# Patient Record
Sex: Female | Born: 2000 | Hispanic: Yes | Marital: Single | Smoking: Never smoker
Health system: Southern US, Community
[De-identification: ages and names within clinical notes are randomized; demographics above are authoritative.]

---
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
## 2020-08-23 ENCOUNTER — Other Ambulatory Visit: Payer: Self-pay

## 2020-08-23 ENCOUNTER — Emergency Department
Admission: EM | Admit: 2020-08-23 | Discharge: 2020-08-23 | Disposition: A | Discharge status: Home / Self Care | Payer: Medicaid Other | Attending: Emergency Medicine | Admitting: Emergency Medicine

## 2020-08-23 DIAGNOSIS — R109 Unspecified abdominal pain: Secondary | ICD-10-CM | POA: Insufficient documentation

## 2020-08-23 DIAGNOSIS — R110 Nausea: Secondary | ICD-10-CM | POA: Diagnosis not present

## 2020-08-23 DIAGNOSIS — Z5321 Procedure and treatment not carried out due to patient leaving prior to being seen by health care provider: Secondary | ICD-10-CM | POA: Diagnosis not present

## 2020-08-23 LAB — COMPREHENSIVE METABOLIC PANEL
ALT: 36 U/L (ref 0–44)
AST: 28 U/L (ref 15–41)
Albumin: 4.4 g/dL (ref 3.5–5.0)
Alkaline Phosphatase: 68 U/L (ref 38–126)
Anion gap: 10 (ref 5–15)
BUN: 9 mg/dL (ref 6–20)
CO2: 21 mmol/L — ABNORMAL LOW (ref 22–32)
Calcium: 9.1 mg/dL (ref 8.9–10.3)
Chloride: 108 mmol/L (ref 98–111)
Creatinine, Ser: 0.59 mg/dL (ref 0.44–1.00)
GFR, Estimated: >60 mL/min (ref >60)
Glucose, Bld: 102 mg/dL — ABNORMAL HIGH (ref 70–99)
Potassium: 4.1 mmol/L (ref 3.5–5.1)
Sodium: 139 mmol/L (ref 135–145)
Total Bilirubin: 0.6 mg/dL (ref 0.3–1.2)
Total Protein: 7.4 g/dL (ref 6.5–8.1)

## 2020-08-23 LAB — URINALYSIS, COMPLETE (UACMP) WITH MICROSCOPIC
Bacteria, UA: NONE SEEN
Bilirubin Urine: NEGATIVE
Glucose, UA: NEGATIVE mg/dL
Ketones, ur: NEGATIVE mg/dL
Leukocytes,Ua: NEGATIVE
Nitrite: NEGATIVE
Protein, ur: NEGATIVE mg/dL
Specific Gravity, Urine: 1.013 (ref 1.005–1.030)
pH: 6 (ref 5.0–8.0)

## 2020-08-23 LAB — POC URINE PREG, ED: Preg Test, Ur: NEGATIVE

## 2020-08-23 LAB — CBC
HCT: 39.3 % (ref 36.0–46.0)
Hemoglobin: 12.8 g/dL (ref 12.0–15.0)
MCH: 28.3 pg (ref 26.0–34.0)
MCHC: 32.6 g/dL (ref 30.0–36.0)
MCV: 86.9 fL (ref 80.0–100.0)
Platelets: 319 10*3/uL (ref 150–400)
RBC: 4.52 MIL/uL (ref 3.87–5.11)
RDW: 13.4 % (ref 11.5–15.5)
WBC: 9.3 10*3/uL (ref 4.0–10.5)
nRBC: 0 % (ref 0.0–0.2)

## 2020-08-23 LAB — LIPASE, BLOOD: Lipase: 30 U/L (ref 11–51)

## 2020-08-23 NOTE — ED Triage Notes (Signed)
Pt comes via pOV from home with c/o left sided pain that started this am. Pt states some nausea.

## 2021-07-25 ENCOUNTER — Other Ambulatory Visit: Payer: Self-pay | Admitting: Family Medicine

## 2021-07-25 ENCOUNTER — Ambulatory Visit
Admission: RE | Admit: 2021-07-25 | Discharge: 2021-07-25 | Disposition: A | Discharge status: Home / Self Care | Payer: Medicaid Other | Source: Ambulatory Visit | Attending: Family Medicine | Admitting: Family Medicine

## 2021-07-25 ENCOUNTER — Other Ambulatory Visit: Payer: Self-pay

## 2021-07-25 DIAGNOSIS — N939 Abnormal uterine and vaginal bleeding, unspecified: Secondary | ICD-10-CM | POA: Insufficient documentation

## 2022-05-08 ENCOUNTER — Emergency Department
Admission: EM | Admit: 2022-05-08 | Discharge: 2022-05-08 | Disposition: A | Discharge status: Home / Self Care | Payer: Medicaid Other | Attending: Emergency Medicine | Admitting: Emergency Medicine

## 2022-05-08 ENCOUNTER — Emergency Department: Payer: Medicaid Other

## 2022-05-08 ENCOUNTER — Other Ambulatory Visit: Payer: Self-pay

## 2022-05-08 ENCOUNTER — Encounter: Payer: Self-pay | Admitting: Emergency Medicine

## 2022-05-08 DIAGNOSIS — Y92410 Unspecified street and highway as the place of occurrence of the external cause: Secondary | ICD-10-CM | POA: Diagnosis not present

## 2022-05-08 DIAGNOSIS — M545 Low back pain, unspecified: Secondary | ICD-10-CM

## 2022-05-08 DIAGNOSIS — M25572 Pain in left ankle and joints of left foot: Secondary | ICD-10-CM | POA: Diagnosis not present

## 2022-05-08 MED ORDER — LIDOCAINE 5 % EX PTCH
1.0000 | MEDICATED_PATCH | CUTANEOUS | Status: DC
  Administered 2022-05-08: 1 via TRANSDERMAL
  Filled 2022-05-08: qty 1

## 2022-05-08 MED ORDER — ACETAMINOPHEN 325 MG PO TABS
650.0000 mg | ORAL_TABLET | Freq: Once | ORAL | Status: AC
  Administered 2022-05-08: 650 mg via ORAL
  Filled 2022-05-08: qty 2

## 2022-05-08 MED ORDER — IBUPROFEN 600 MG PO TABS
600.0000 mg | ORAL_TABLET | Freq: Once | ORAL | Status: AC
  Administered 2022-05-08: 600 mg via ORAL
  Filled 2022-05-08: qty 1

## 2022-05-08 MED ORDER — LIDOCAINE 5 % EX PTCH: 1.0000 | MEDICATED_PATCH | Freq: Two times a day (BID) | CUTANEOUS | 0 refills | Status: AC
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
## 2022-05-08 NOTE — Discharge Instructions (Signed)
Your x-rays were normal.  You may continue to take Tylenol/ibuprofen per package instructions to help with your pain.  You may also use the Lidoderm patches as prescribed.  Please return for any new, worsening, or change in symptoms or other concerns.  It was a pleasure caring for you today.
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
## 2022-06-26 ENCOUNTER — Emergency Department: Payer: Medicaid Other

## 2022-06-26 ENCOUNTER — Other Ambulatory Visit: Payer: Self-pay

## 2022-06-26 ENCOUNTER — Emergency Department
Admission: EM | Admit: 2022-06-26 | Discharge: 2022-06-26 | Discharge status: Against Medical Advice | Payer: Medicaid Other | Attending: Emergency Medicine | Admitting: Emergency Medicine

## 2022-06-26 DIAGNOSIS — R1030 Lower abdominal pain, unspecified: Secondary | ICD-10-CM | POA: Diagnosis not present

## 2022-06-26 DIAGNOSIS — R112 Nausea with vomiting, unspecified: Secondary | ICD-10-CM | POA: Insufficient documentation

## 2022-06-26 DIAGNOSIS — Z5329 Procedure and treatment not carried out because of patient's decision for other reasons: Secondary | ICD-10-CM | POA: Diagnosis not present

## 2022-06-26 DIAGNOSIS — M549 Dorsalgia, unspecified: Secondary | ICD-10-CM | POA: Diagnosis not present

## 2022-06-26 LAB — CBC
HCT: 41.4 % (ref 36.0–46.0)
Hemoglobin: 13.3 g/dL (ref 12.0–15.0)
MCH: 28.5 pg (ref 26.0–34.0)
MCHC: 32.1 g/dL (ref 30.0–36.0)
MCV: 88.7 fL (ref 80.0–100.0)
Platelets: 329 10*3/uL (ref 150–400)
RBC: 4.67 MIL/uL (ref 3.87–5.11)
RDW: 13.2 % (ref 11.5–15.5)
WBC: 8.2 10*3/uL (ref 4.0–10.5)
nRBC: 0 % (ref 0.0–0.2)

## 2022-06-26 LAB — URINALYSIS, ROUTINE W REFLEX MICROSCOPIC
Bilirubin Urine: NEGATIVE
Glucose, UA: NEGATIVE mg/dL
Ketones, ur: NEGATIVE mg/dL
Leukocytes,Ua: NEGATIVE
Nitrite: NEGATIVE
Protein, ur: NEGATIVE mg/dL
Specific Gravity, Urine: 1.012 (ref 1.005–1.030)
pH: 8 (ref 5.0–8.0)

## 2022-06-26 LAB — COMPREHENSIVE METABOLIC PANEL
ALT: 22 U/L (ref 0–44)
AST: 23 U/L (ref 15–41)
Albumin: 4.4 g/dL (ref 3.5–5.0)
Alkaline Phosphatase: 71 U/L (ref 38–126)
Anion gap: 6 (ref 5–15)
BUN: 10 mg/dL (ref 6–20)
CO2: 25 mmol/L (ref 22–32)
Calcium: 9.1 mg/dL (ref 8.9–10.3)
Chloride: 108 mmol/L (ref 98–111)
Creatinine, Ser: 0.73 mg/dL (ref 0.44–1.00)
GFR, Estimated: >60 mL/min (ref >60)
Glucose, Bld: 102 mg/dL — ABNORMAL HIGH (ref 70–99)
Potassium: 4.2 mmol/L (ref 3.5–5.1)
Sodium: 139 mmol/L (ref 135–145)
Total Bilirubin: 0.9 mg/dL (ref 0.3–1.2)
Total Protein: 7.7 g/dL (ref 6.5–8.1)

## 2022-06-26 LAB — POC URINE PREG, ED: Preg Test, Ur: NEGATIVE

## 2022-06-26 LAB — LIPASE, BLOOD: Lipase: 23 U/L (ref 11–51)

## 2022-06-26 MED ORDER — ONDANSETRON 4 MG PO TBDP
4.0000 mg | ORAL_TABLET | Freq: Once | ORAL | Status: AC
  Administered 2022-06-26: 4 mg via ORAL
  Filled 2022-06-26: qty 1

## 2022-06-26 MED ORDER — ACETAMINOPHEN 500 MG PO TABS
1000.0000 mg | ORAL_TABLET | Freq: Once | ORAL | Status: AC
  Administered 2022-06-26: 1000 mg via ORAL
  Filled 2022-06-26: qty 2

## 2022-06-26 NOTE — ED Notes (Signed)
Pt not in room for ct scan  Provider aware

## 2022-06-26 NOTE — ED Provider Notes (Signed)
[facility] Provider Note    Event Date/Time   First MD Initiated Contact with Patient 06/26/22 [id number]     (approximate)   History   Chief Complaint Abdominal Pain   HPI Joyce Tran is a 21 y.o. female, no significant medical history, presents emergency department for evaluation of abdominal pain.  She states that she began having lower abdominal pain around the bladder region that started yesterday, associated with intermittent episodes of nausea/vomiting.  She has had a total of 2 episodes of vomiting.  Additionally, she states that she is also been having some left-sided back pain that has been going on for a couple months on and off.  No prior history of kidney stones.  Denies fever/chills, chest pain, shortness of breath, diarrhea, dysuria, headache, rash/lesions, numbness/tingling upper or lower extremities, or dizziness/lightheadedness.  History Limitations: No limitations.        Physical Exam  Triage Vital Signs: ED Triage Vitals  Enc Vitals Group     BP 06/26/22 0940 (!) 119/90     Pulse Rate 06/26/22 0940 79     Resp 06/26/22 0940 18     Temp 06/26/22 0940 98.5 F (36.9 C)     Temp Source 06/26/22 0940 Oral     SpO2 06/26/22 0940 97 %     Weight --      Height --      Head Circumference --      Peak Flow --      Pain Score 06/26/22 0933 9     Pain Loc --      Pain Edu? --      Excl. in GC? --     Most recent vital signs: Vitals:   06/26/22 0940 06/26/22 1153  BP: (!) 119/90 120/88  Pulse: 79 80  Resp: 18 18  Temp: 98.5 F (36.9 C)   SpO2: 97% 98%    General: Awake, NAD.  Skin: Warm, dry. No rashes or lesions.  Eyes: PERRL. Conjunctivae normal.  CV: Good peripheral perfusion.  Resp: Normal effort.  Abd: Soft, no distention.  Mild tenderness along the bladder region. Neuro: At baseline. No gross neurological deficits.  Musculoskeletal: Normal ROM of all extremities.  Positive CVAT on the left side.  Focused  Exam: N/A.  Physical Exam    ED Results / Procedures / Treatments  Labs (all labs ordered are listed, but only abnormal results are displayed) Labs Reviewed  COMPREHENSIVE METABOLIC PANEL - Abnormal; Notable for the following components:      Result Value   Glucose, Bld 102 (*)    All other components within normal limits  URINALYSIS, ROUTINE W REFLEX MICROSCOPIC - Abnormal; Notable for the following components:   Color, Urine YELLOW (*)    APPearance HAZY (*)    Hgb urine dipstick SMALL (*)    Bacteria, UA RARE (*)    All other components within normal limits  LIPASE, BLOOD  CBC  POC URINE PREG, ED     EKG N/A.   RADIOLOGY  ED Provider Interpretation: N/A.  No results found.  PROCEDURES:  Critical Care performed: N/A.  Procedures    MEDICATIONS ORDERED IN ED: Medications  ondansetron (ZOFRAN-ODT) disintegrating tablet 4 mg (4 mg Oral Given 06/26/22 1002)  acetaminophen (TYLENOL) tablet 1,000 mg (1,000 mg Oral Given 06/26/22 1002)     IMPRESSION / MDM / ASSESSMENT AND PLAN / ED COURSE  I reviewed the triage vital signs and the nursing notes.  Differential diagnosis includes, but is not limited to, cystitis, pyelonephritis, ureterolithiasis, nephrolithiasis, thoracic/lumbar strain, ovarian cyst  ED Course Patient appears well, vitals within normal limits.  NAD.  We will provide her with ondansetron and acetaminophen.  CBC shows no leukocytosis or anemia.  CMP shows no electrolyte abnormalities, transaminitis, or AKI.  Urinalysis shows small amounts hemoglobin and rare bacteria, otherwise no evidence of infection.  Point-of-care urine pregnancy negative.  Lipase unremarkable at 23.  Assessment/Plan Patient presents with abdominal pain x2 days, associated with some pain in her left back/flank.  Pain appears mild.  Lab work-up is unremarkable, though patient does have small amounts hemoglobin on urinalysis without evidence of  infection.  Spoke with the patient about CT renal stone study to evaluate for ureterolithiasis, which patient agreed to.  However, patient ultimately eloped prior to getting scan.  Very low suspicion for any serious life-threatening pathology at this time though.  I would be happy to complete her evaluation and care if she were to return.  Patient's presentation is most consistent with acute complicated illness / injury requiring diagnostic workup.       FINAL CLINICAL IMPRESSION(S) / ED DIAGNOSES   Final diagnoses:  None     Rx / DC Orders   ED Discharge Orders     None        Note:  This document was prepared using Dragon voice recognition software and may include unintentional dictation errors.   Varney Daily, [place] 06/26/22 1652    Phineas Semen, MD 06/27/22 [id number]

## 2022-06-26 NOTE — ED Triage Notes (Signed)
Pt come with c/o lower belly pain since yesterday. Pt states nausea and vomiting. Pt states unsure if she is pregnant.

## 2022-06-26 NOTE — ED Notes (Signed)
Pt not in room when CT tech came to get pt. This Clinical research associate called pt who did not answer.

## 2023-07-10 IMAGING — US US OB < 14 WEEKS - US OB TV
1 series · 14 of 28 positions shown · non-contrast
Comparison: None.

CLINICAL DATA: Vaginal bleeding. Patient is pregnant, 6 weeks and 6
days based on her last menstrual period.

EXAM:
OBSTETRIC <14 WK US AND TRANSVAGINAL OB US
TECHNIQUE: Both transabdominal and transvaginal ultrasound examinations were
performed for complete evaluation of the gestation as well as the
maternal uterus, adnexal regions, and pelvic cul-de-sac.
Transvaginal technique was performed to assess early pregnancy.

[Series 1: us ob < 14 weeks - us ob tv · 0.23mm/px · 14 of 128 slices shown]
[im 5/128]
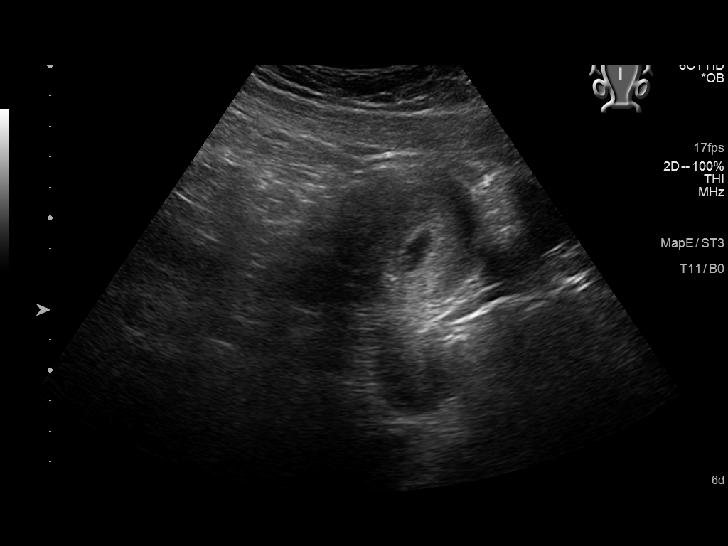
[im 15/128]
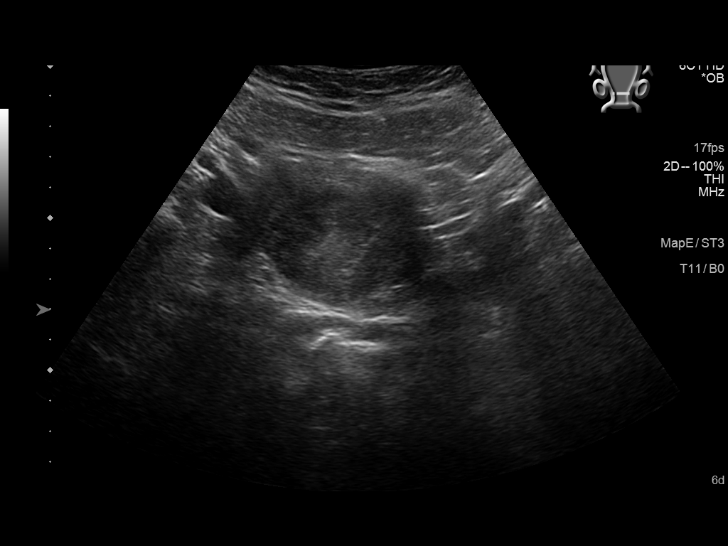
[im 24/128]
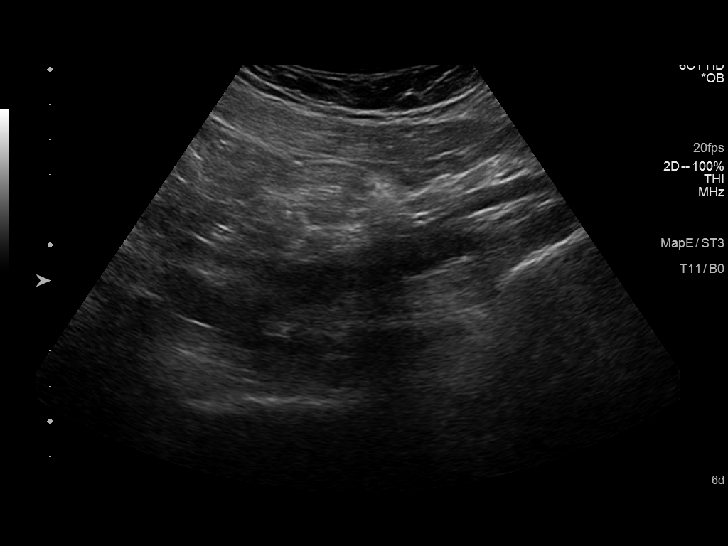
[im 33/128]
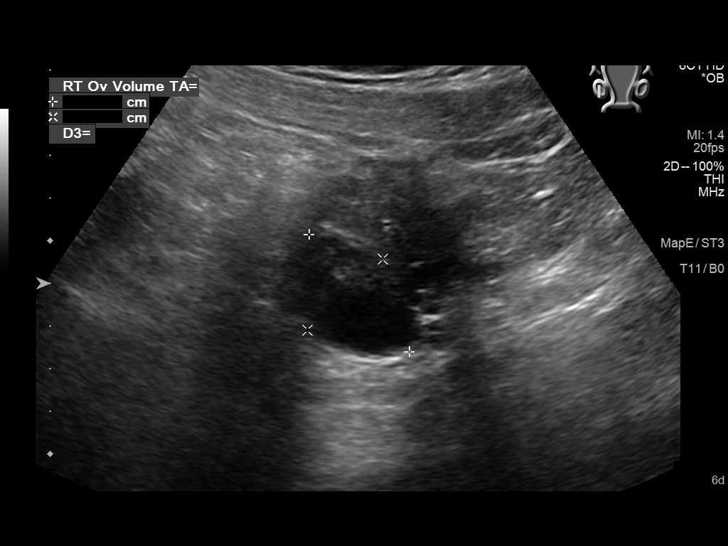
[im 43/128]
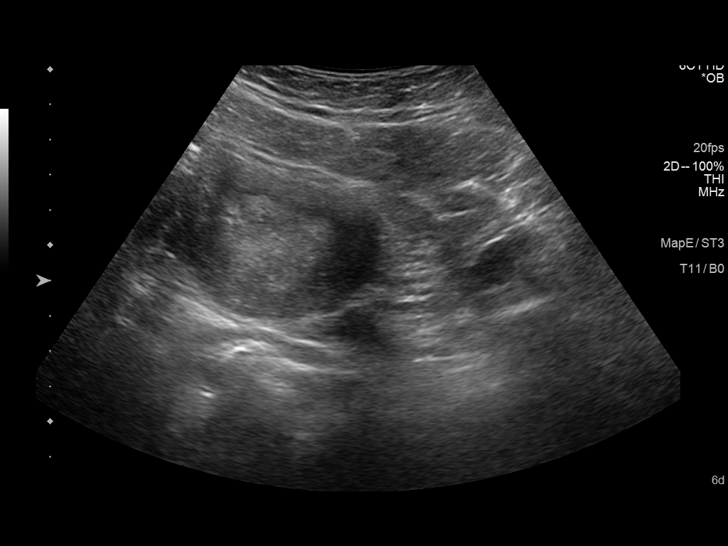
[im 52/128]
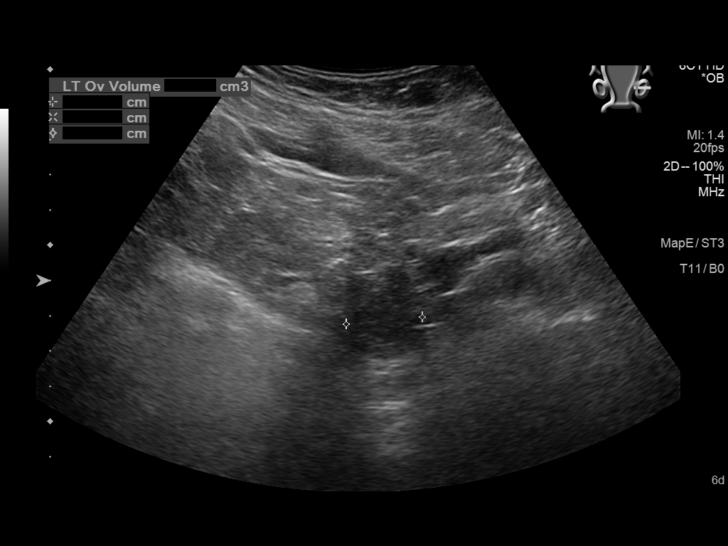
[im 62/128]
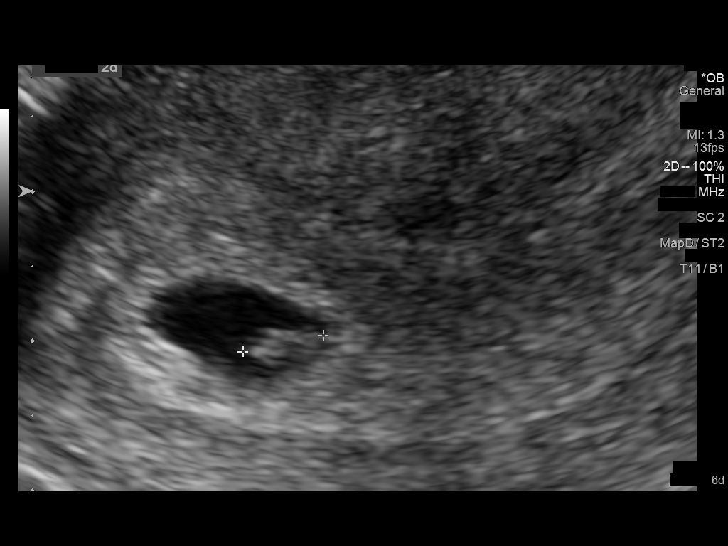
[im 71/128]
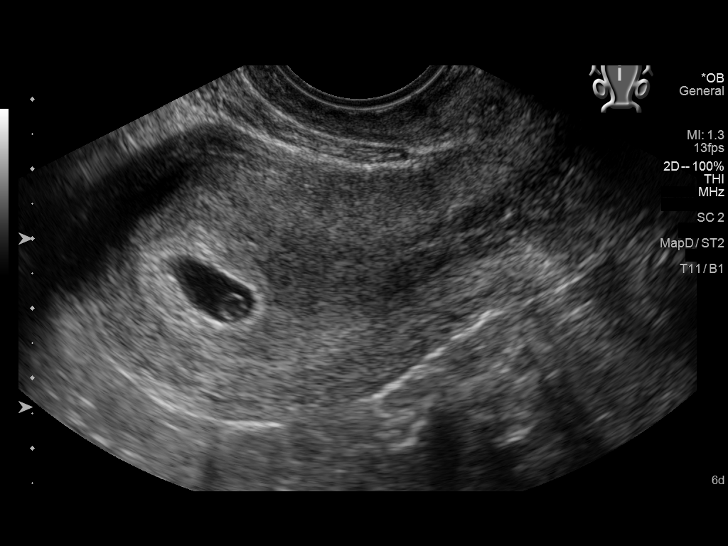
[im 80/128]
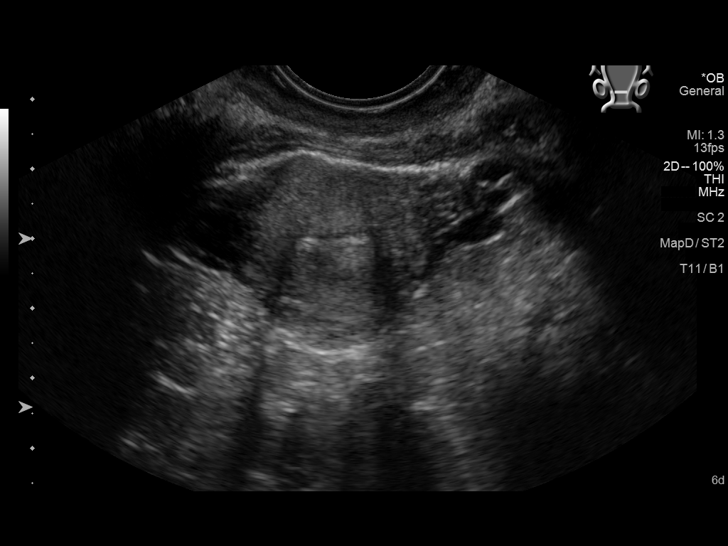
[im 90/128]
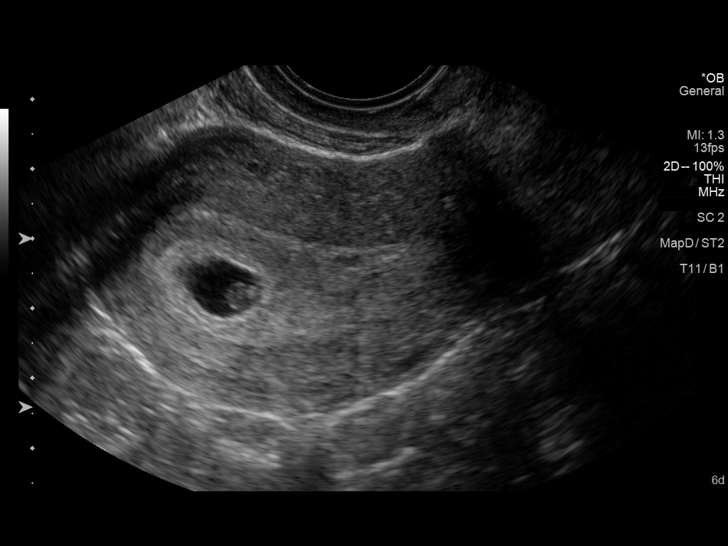
[im 99/128]
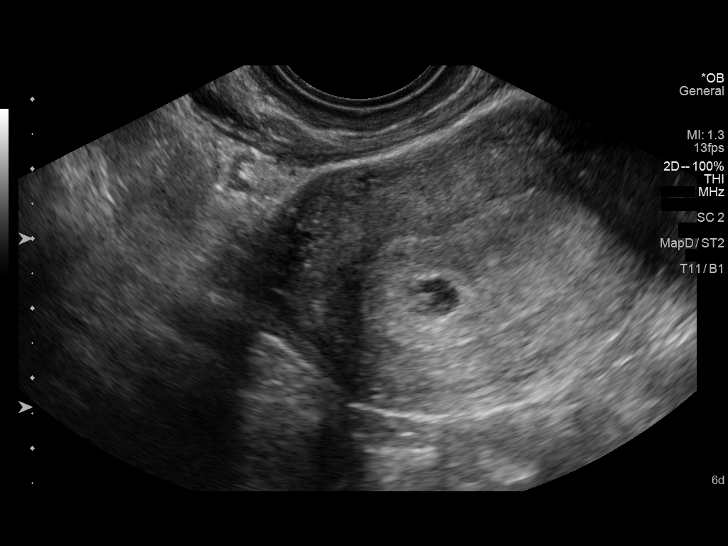
[im 109/128]
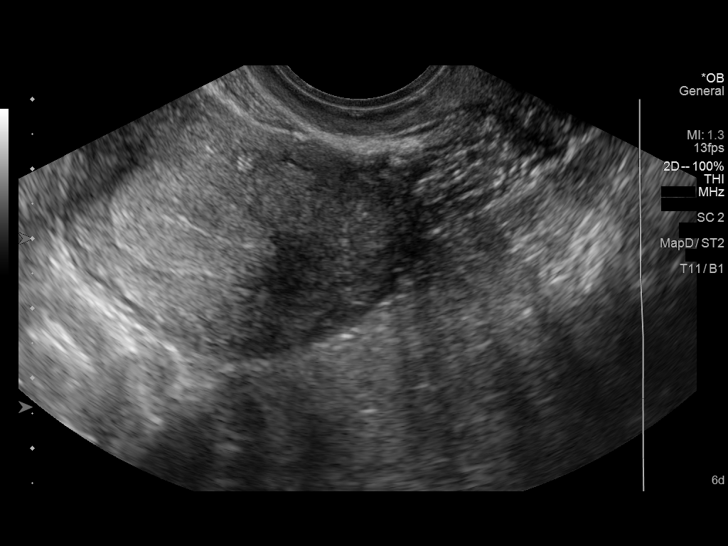
[im 118/128]
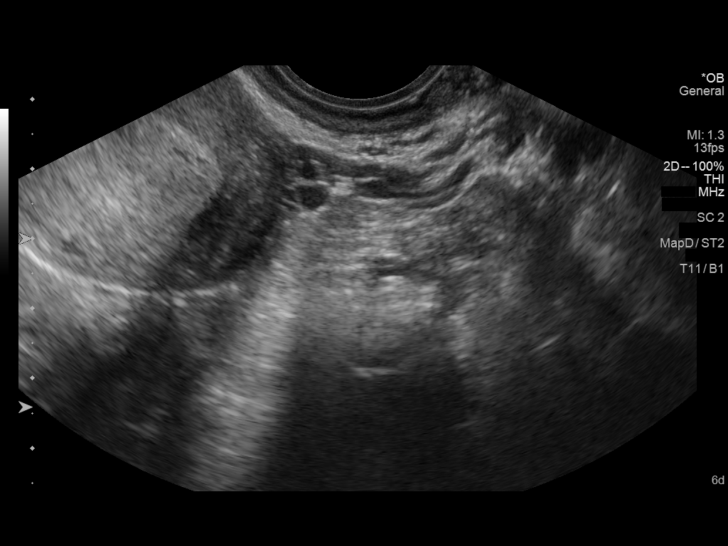
[im 128/128]
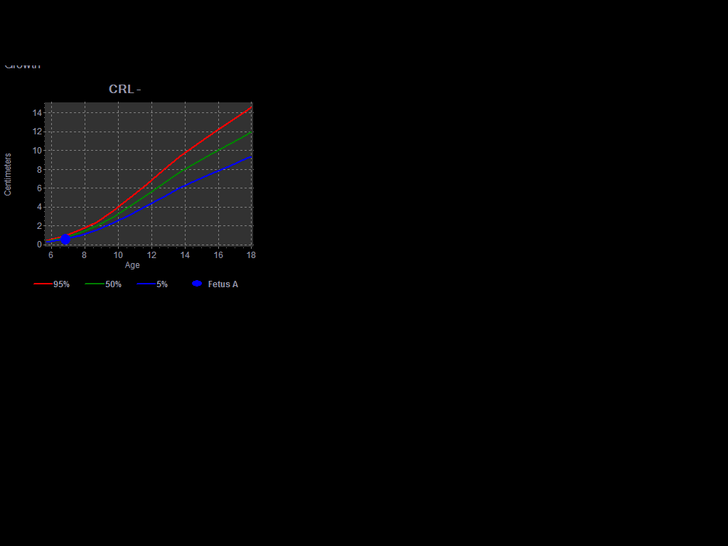

[14 of 28 positions shown; findings below may reference images not displayed]

FINDINGS: Intrauterine gestational sac: Single

Yolk sac:  Visualized.

Embryo:  Visualized.

Cardiac Activity: Visualized.

Heart Rate: 111 bpm

CRL:  5.3 mm   6 w   2 d                  US EDC: 03/18/2022

Subchorionic hemorrhage:  None visualized.

Maternal uterus/adnexae: No uterine masses. Cervix is closed. Normal
ovaries and adnexa. No pelvic free fluid.
IMPRESSION: 1. Single live intrauterine pregnancy with a measured gestational
age of 6 weeks and 2 days, concordant with the expected gestational
age based on the patient's last menstrual period. No pregnancy
complication. Normal ovaries and adnexa.
